# Patient Record
Sex: Male | Born: 1955 | Race: White | Hispanic: No | Marital: Married
Health system: Southern US, Community
[De-identification: ages and names within clinical notes are randomized; demographics above are authoritative.]

---

## 2007-10-14 ENCOUNTER — Ambulatory Visit: Payer: Self-pay | Admitting: Gastroenterology

## 2007-10-15 ENCOUNTER — Encounter: Payer: Self-pay | Admitting: Gastroenterology

## 2007-10-28 ENCOUNTER — Ambulatory Visit: Payer: Self-pay | Admitting: Gastroenterology

## 2015-10-05 DIAGNOSIS — Z1211 Encounter for screening for malignant neoplasm of colon: Secondary | ICD-10-CM | POA: Diagnosis not present

## 2015-10-05 DIAGNOSIS — M545 Low back pain: Secondary | ICD-10-CM | POA: Diagnosis not present

## 2015-10-05 DIAGNOSIS — E785 Hyperlipidemia, unspecified: Secondary | ICD-10-CM | POA: Diagnosis not present

## 2015-10-05 DIAGNOSIS — Z0000 Encounter for general adult medical examination without abnormal findings: Secondary | ICD-10-CM | POA: Diagnosis not present

## 2015-10-05 DIAGNOSIS — B351 Tinea unguium: Secondary | ICD-10-CM | POA: Diagnosis not present

## 2015-10-05 DIAGNOSIS — Z125 Encounter for screening for malignant neoplasm of prostate: Secondary | ICD-10-CM | POA: Diagnosis not present

## 2015-10-06 ENCOUNTER — Other Ambulatory Visit: Payer: Self-pay | Admitting: Family Medicine

## 2015-10-06 ENCOUNTER — Ambulatory Visit
Admission: RE | Admit: 2015-10-06 | Discharge: 2015-10-06 | Disposition: A | Discharge status: Home / Self Care | Payer: BLUE CROSS/BLUE SHIELD | Source: Ambulatory Visit | Attending: Family Medicine | Admitting: Family Medicine

## 2015-10-06 DIAGNOSIS — M545 Low back pain, unspecified: Secondary | ICD-10-CM

## 2015-11-18 DIAGNOSIS — B351 Tinea unguium: Secondary | ICD-10-CM | POA: Diagnosis not present

## 2016-02-15 DIAGNOSIS — Z23 Encounter for immunization: Secondary | ICD-10-CM | POA: Diagnosis not present

## 2016-09-05 DIAGNOSIS — H5213 Myopia, bilateral: Secondary | ICD-10-CM | POA: Diagnosis not present

## 2016-09-05 DIAGNOSIS — H524 Presbyopia: Secondary | ICD-10-CM | POA: Diagnosis not present

## 2016-09-05 DIAGNOSIS — H31003 Unspecified chorioretinal scars, bilateral: Secondary | ICD-10-CM | POA: Diagnosis not present

## 2016-10-10 DIAGNOSIS — E785 Hyperlipidemia, unspecified: Secondary | ICD-10-CM | POA: Diagnosis not present

## 2016-10-10 DIAGNOSIS — Z125 Encounter for screening for malignant neoplasm of prostate: Secondary | ICD-10-CM | POA: Diagnosis not present

## 2016-10-10 DIAGNOSIS — Z1211 Encounter for screening for malignant neoplasm of colon: Secondary | ICD-10-CM | POA: Diagnosis not present

## 2016-10-10 DIAGNOSIS — Z0000 Encounter for general adult medical examination without abnormal findings: Secondary | ICD-10-CM | POA: Diagnosis not present

## 2017-01-14 DIAGNOSIS — L821 Other seborrheic keratosis: Secondary | ICD-10-CM | POA: Diagnosis not present

## 2017-01-14 DIAGNOSIS — D225 Melanocytic nevi of trunk: Secondary | ICD-10-CM | POA: Diagnosis not present

## 2017-01-14 DIAGNOSIS — D1800 Hemangioma unspecified site: Secondary | ICD-10-CM | POA: Diagnosis not present

## 2017-01-14 DIAGNOSIS — L814 Other melanin hyperpigmentation: Secondary | ICD-10-CM | POA: Diagnosis not present

## 2017-08-05 DIAGNOSIS — B9789 Other viral agents as the cause of diseases classified elsewhere: Secondary | ICD-10-CM | POA: Diagnosis not present

## 2017-08-05 DIAGNOSIS — J069 Acute upper respiratory infection, unspecified: Secondary | ICD-10-CM | POA: Diagnosis not present

## 2017-09-24 ENCOUNTER — Encounter: Payer: Self-pay | Admitting: Gastroenterology

## 2017-10-11 DIAGNOSIS — Z1211 Encounter for screening for malignant neoplasm of colon: Secondary | ICD-10-CM | POA: Diagnosis not present

## 2017-10-11 DIAGNOSIS — Z125 Encounter for screening for malignant neoplasm of prostate: Secondary | ICD-10-CM | POA: Diagnosis not present

## 2017-10-11 DIAGNOSIS — Z0000 Encounter for general adult medical examination without abnormal findings: Secondary | ICD-10-CM | POA: Diagnosis not present

## 2017-10-11 DIAGNOSIS — Z23 Encounter for immunization: Secondary | ICD-10-CM | POA: Diagnosis not present

## 2017-10-11 DIAGNOSIS — E785 Hyperlipidemia, unspecified: Secondary | ICD-10-CM | POA: Diagnosis not present

## 2017-12-25 DIAGNOSIS — Z23 Encounter for immunization: Secondary | ICD-10-CM | POA: Diagnosis not present

## 2018-01-15 DIAGNOSIS — E785 Hyperlipidemia, unspecified: Secondary | ICD-10-CM | POA: Diagnosis not present

## 2018-01-23 DIAGNOSIS — L814 Other melanin hyperpigmentation: Secondary | ICD-10-CM | POA: Diagnosis not present

## 2018-01-23 DIAGNOSIS — L820 Inflamed seborrheic keratosis: Secondary | ICD-10-CM | POA: Diagnosis not present

## 2018-01-23 DIAGNOSIS — D1800 Hemangioma unspecified site: Secondary | ICD-10-CM | POA: Diagnosis not present

## 2018-01-23 DIAGNOSIS — L570 Actinic keratosis: Secondary | ICD-10-CM | POA: Diagnosis not present

## 2018-01-23 DIAGNOSIS — D225 Melanocytic nevi of trunk: Secondary | ICD-10-CM | POA: Diagnosis not present

## 2018-01-23 DIAGNOSIS — L821 Other seborrheic keratosis: Secondary | ICD-10-CM | POA: Diagnosis not present

## 2018-04-18 IMAGING — CR DG LUMBAR SPINE COMPLETE 4+V
5 series · 5 of 5 positions shown · non-contrast
Comparison: No prior .

CLINICAL DATA: Low back pain.  Pain after yard work.

EXAM:
LUMBAR SPINE - COMPLETE 4+ VIEW

[w lumbar spine ap]
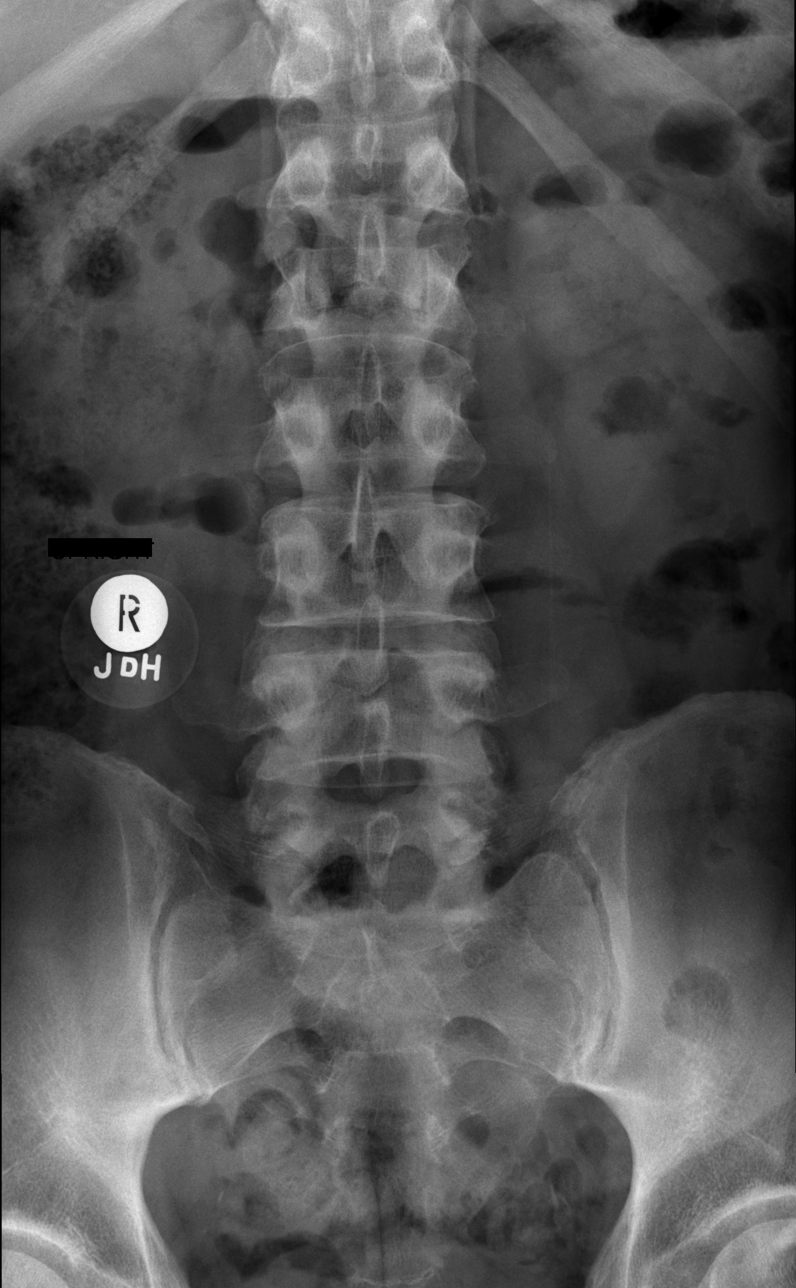

[w lumbar spine obl (1 of 2)]
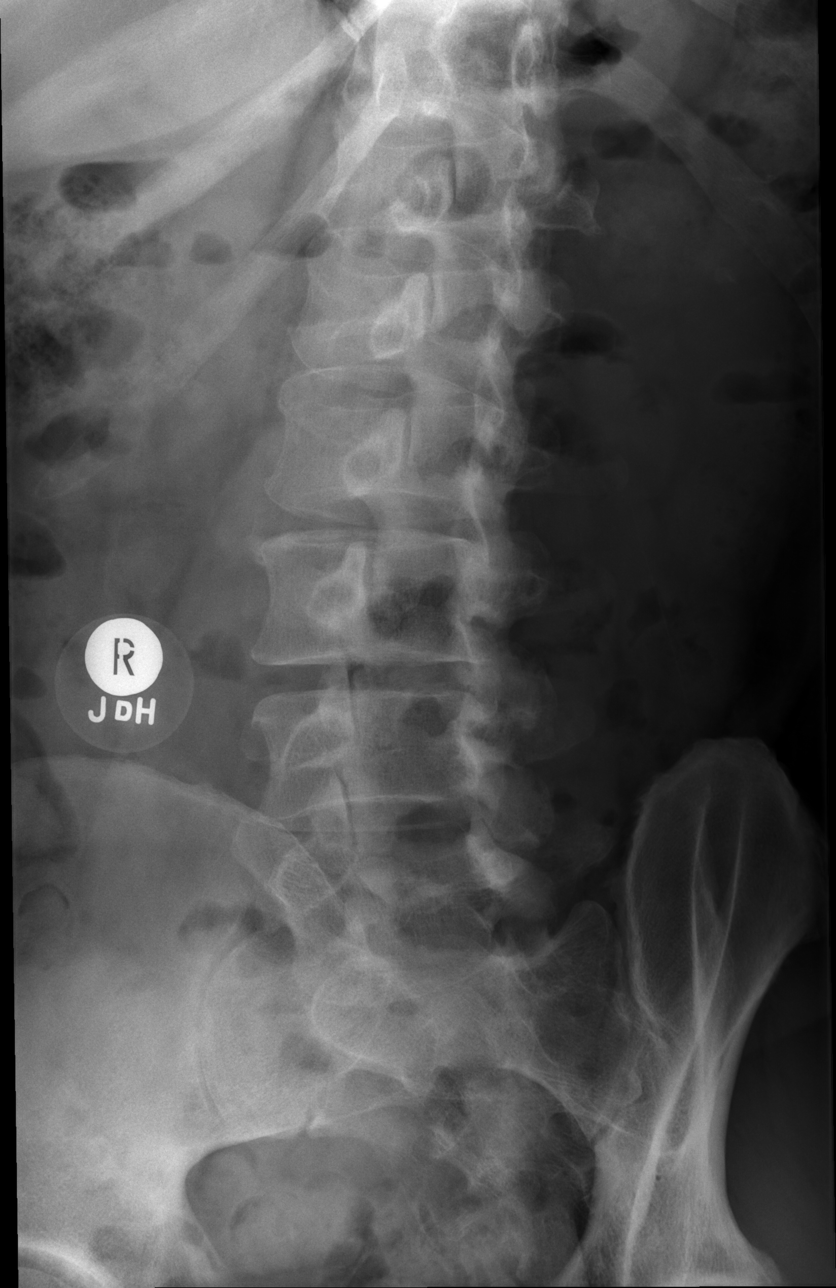

[w lumbar spine obl (2 of 2)]
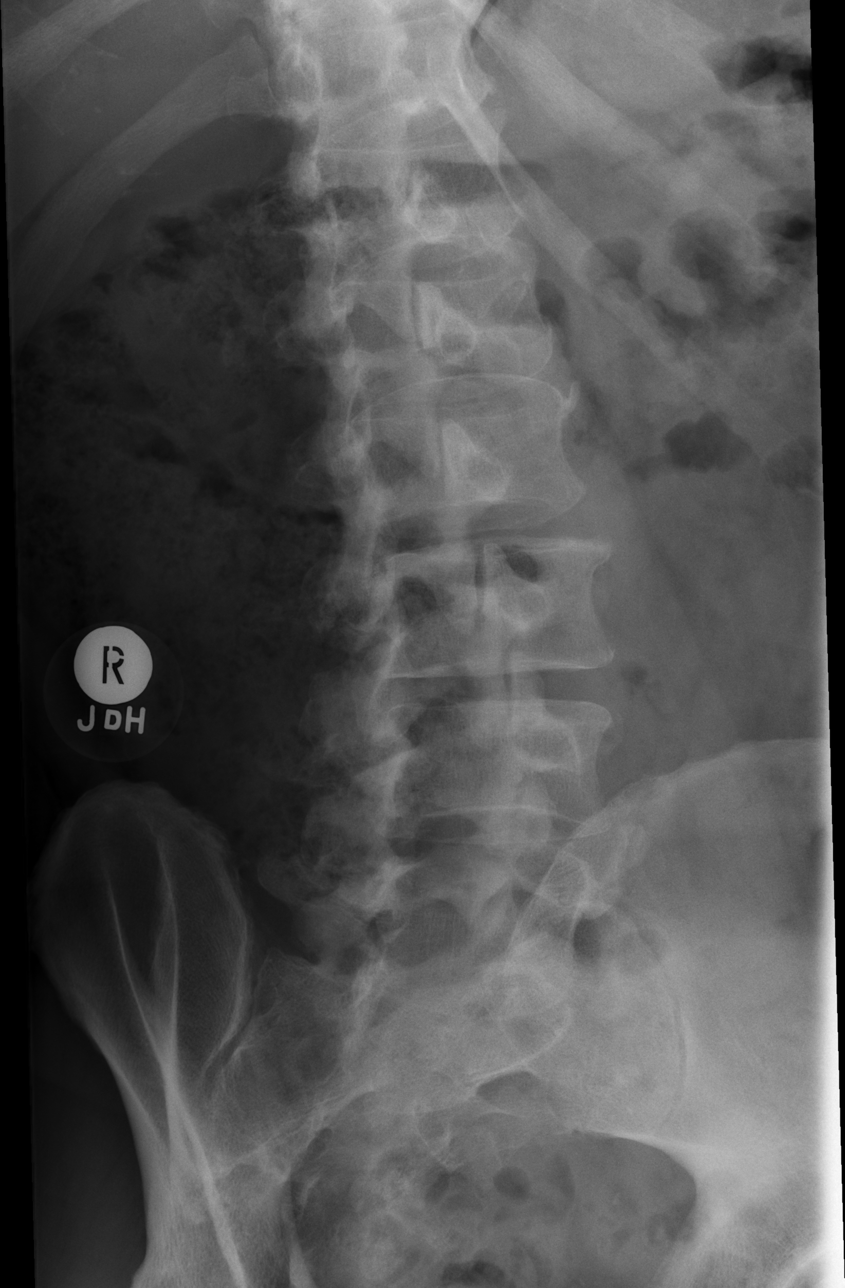

[w lumbar spine lat]
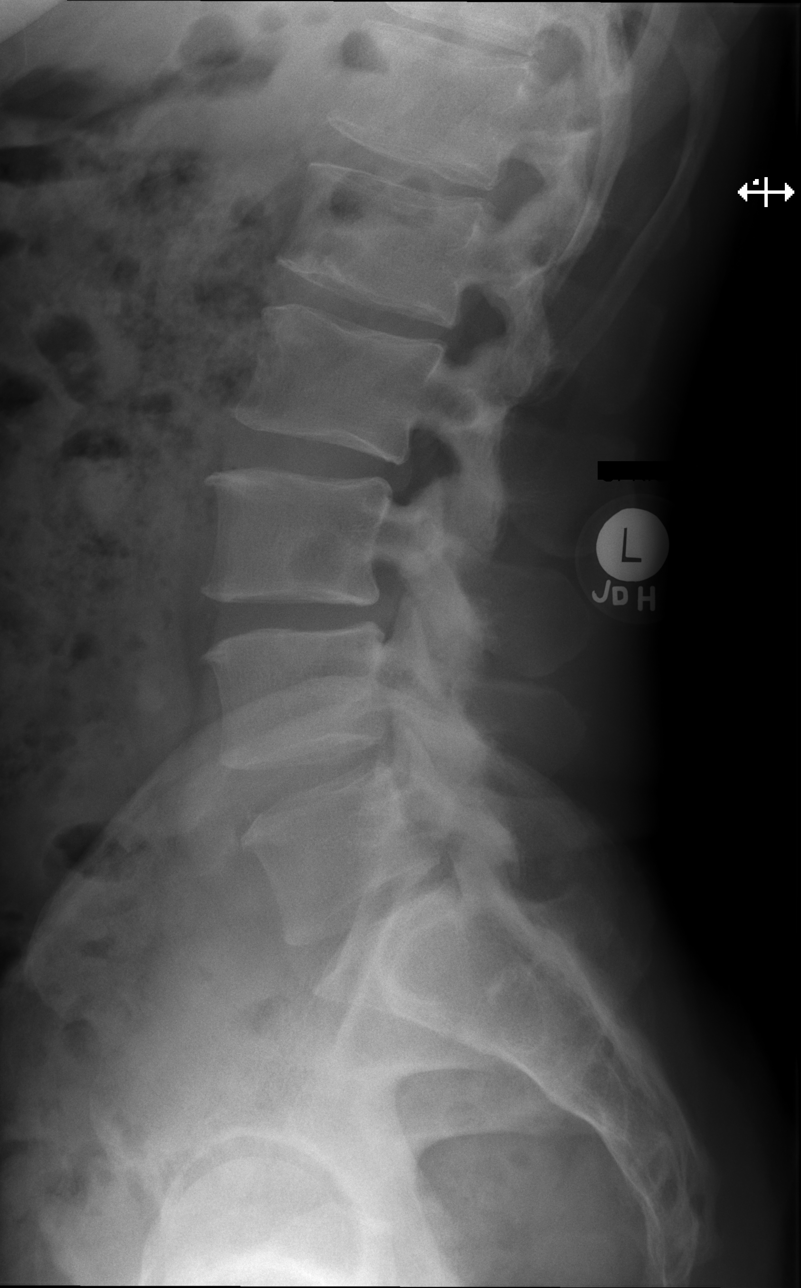

[w lumbar l-5 s-1 spot]
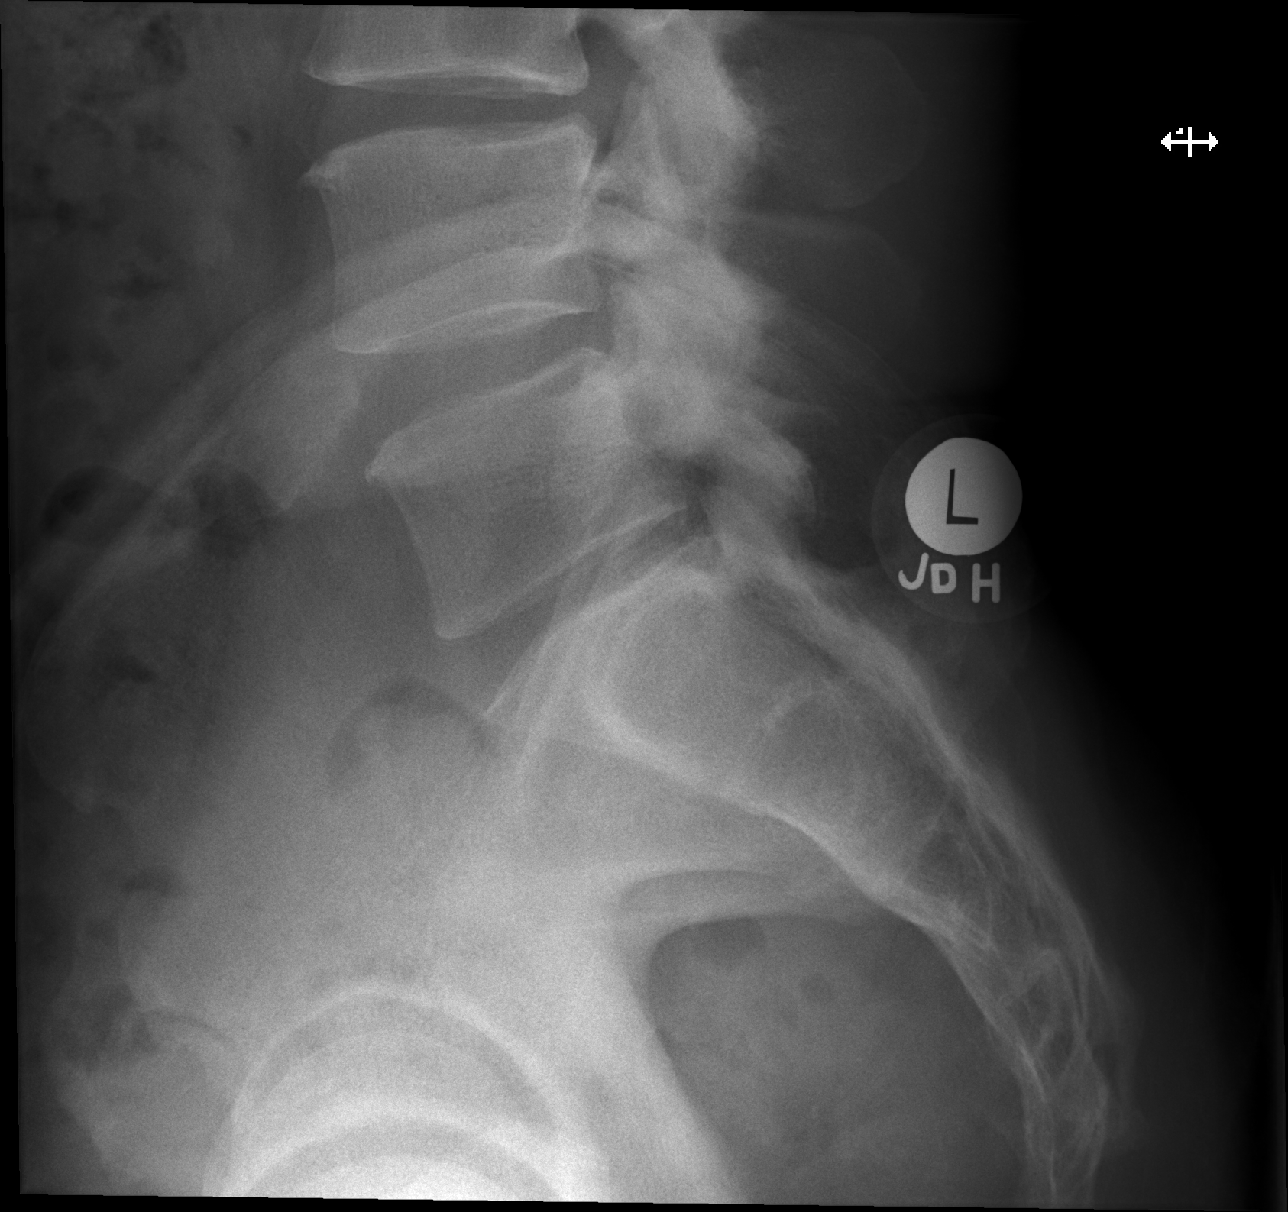

[5 of 5 positions shown; findings below may reference images not displayed]

FINDINGS: Diffuse multilevel degenerative change. No acute abnormality
identified. Normal alignment and mineralization.
IMPRESSION: Diffuse multilevel degenerative change.  No acute abnormality.

## 2018-05-09 DIAGNOSIS — M25569 Pain in unspecified knee: Secondary | ICD-10-CM | POA: Diagnosis not present

## 2018-06-17 DIAGNOSIS — M25562 Pain in left knee: Secondary | ICD-10-CM | POA: Diagnosis not present

## 2018-06-17 DIAGNOSIS — M25561 Pain in right knee: Secondary | ICD-10-CM | POA: Diagnosis not present

## 2018-06-24 DIAGNOSIS — M25562 Pain in left knee: Secondary | ICD-10-CM | POA: Diagnosis not present

## 2018-07-08 DIAGNOSIS — R030 Elevated blood-pressure reading, without diagnosis of hypertension: Secondary | ICD-10-CM | POA: Diagnosis not present

## 2018-07-10 DIAGNOSIS — S83242A Other tear of medial meniscus, current injury, left knee, initial encounter: Secondary | ICD-10-CM | POA: Diagnosis not present

## 2018-07-10 DIAGNOSIS — M25562 Pain in left knee: Secondary | ICD-10-CM | POA: Diagnosis not present

## 2018-07-22 DIAGNOSIS — M23322 Other meniscus derangements, posterior horn of medial meniscus, left knee: Secondary | ICD-10-CM | POA: Diagnosis not present

## 2018-07-22 DIAGNOSIS — G8918 Other acute postprocedural pain: Secondary | ICD-10-CM | POA: Diagnosis not present

## 2018-07-29 DIAGNOSIS — M25562 Pain in left knee: Secondary | ICD-10-CM | POA: Diagnosis not present

## 2018-10-15 DIAGNOSIS — E785 Hyperlipidemia, unspecified: Secondary | ICD-10-CM | POA: Diagnosis not present

## 2018-10-15 DIAGNOSIS — Z0000 Encounter for general adult medical examination without abnormal findings: Secondary | ICD-10-CM | POA: Diagnosis not present

## 2018-10-15 DIAGNOSIS — Z125 Encounter for screening for malignant neoplasm of prostate: Secondary | ICD-10-CM | POA: Diagnosis not present

## 2018-10-30 DIAGNOSIS — Z1211 Encounter for screening for malignant neoplasm of colon: Secondary | ICD-10-CM | POA: Diagnosis not present

## 2018-11-21 DIAGNOSIS — H524 Presbyopia: Secondary | ICD-10-CM | POA: Diagnosis not present

## 2018-11-21 DIAGNOSIS — H2513 Age-related nuclear cataract, bilateral: Secondary | ICD-10-CM | POA: Diagnosis not present

## 2018-11-21 DIAGNOSIS — H31003 Unspecified chorioretinal scars, bilateral: Secondary | ICD-10-CM | POA: Diagnosis not present

## 2018-11-21 DIAGNOSIS — H5213 Myopia, bilateral: Secondary | ICD-10-CM | POA: Diagnosis not present

## 2019-01-02 DIAGNOSIS — Z9889 Other specified postprocedural states: Secondary | ICD-10-CM | POA: Diagnosis not present

## 2019-01-02 DIAGNOSIS — R600 Localized edema: Secondary | ICD-10-CM | POA: Diagnosis not present

## 2019-03-12 DIAGNOSIS — E782 Mixed hyperlipidemia: Secondary | ICD-10-CM | POA: Diagnosis not present

## 2019-08-01 ENCOUNTER — Ambulatory Visit: Payer: 59 | Attending: Internal Medicine

## 2019-08-01 DIAGNOSIS — Z23 Encounter for immunization: Secondary | ICD-10-CM

## 2019-08-01 NOTE — Progress Notes (Signed)
   [facility]ov[facility] Vaccination [facility]  Name:  Martin Campos    MRN: [id number] DOB: [birth year]  08/01/2019  Mr. Martin Campos was observed post Covid-19 immunization for 15 minutes without incident. He was provided with Vaccine Information Sheet and instruction to access the V-Safe system.   Mr. Martin Campos was instructed to call 911 with any severe reactions post vaccine: Marland Kitchen Difficulty breathing  . Swelling of face and throat  . A fast heartbeat  . A bad rash all over body  . Dizziness and weakness   Immunizations Administered    Name Date Dose VIS Date Route   Pfizer COVID-19 Vaccine 08/01/2019  9:21 AM 0.3 mL 04/17/2019 Intramuscular   Manufacturer: [employer], [employer]   Lot: [id number]   NDC: [address]1

## 2019-08-26 ENCOUNTER — Ambulatory Visit: Payer: 59 | Attending: Internal Medicine

## 2019-08-26 DIAGNOSIS — Z23 Encounter for immunization: Secondary | ICD-10-CM

## 2019-08-26 NOTE — Progress Notes (Signed)
   Covid-19 Vaccination Clinic  Name:  KALIM KISSEL    MRN: [id number] DOB: 1955-06-22  08/26/2019  Mr. Ponder was observed post Covid-19 immunization for 15 minutes without incident. He was provided with Vaccine Information Sheet and instruction to access the V-Safe system.   Mr. Ingwersen was instructed to call 911 with any severe reactions post vaccine: Marland Kitchen Difficulty breathing  . Swelling of face and throat  . A fast heartbeat  . A bad rash all over body  . Dizziness and weakness   Immunizations Administered    Name Date Dose VIS Date Route   Pfizer COVID-19 Vaccine 08/26/2019  8:50 AM 0.3 mL 07/01/2018 Intramuscular   Manufacturer: [employer], [employer]   Lot: [id number]   NDC: [address]-2
